# Patient Record
Sex: Male | Born: 1935 | Race: Black or African American | Hispanic: No | Marital: Single
Health system: Southern US, Community
[De-identification: ages and names within clinical notes are randomized; demographics above are authoritative.]

---

## 2003-10-18 ENCOUNTER — Ambulatory Visit: Payer: Self-pay | Admitting: Internal Medicine

## 2003-11-14 ENCOUNTER — Ambulatory Visit: Payer: Self-pay | Admitting: Internal Medicine

## 2011-07-14 ENCOUNTER — Ambulatory Visit: Payer: Self-pay | Admitting: Internal Medicine

## 2011-08-06 ENCOUNTER — Inpatient Hospital Stay: Payer: Self-pay | Admitting: Internal Medicine

## 2011-08-06 LAB — URINALYSIS, COMPLETE
Hyaline Cast: 53
Nitrite: NEGATIVE
Ph: 5 (ref 4.5–8.0)
Protein: 100
Specific Gravity: 1.023 (ref 1.003–1.030)

## 2011-08-06 LAB — LIPASE, BLOOD: Lipase: 87 U/L (ref 73–393)

## 2011-08-06 LAB — CBC WITH DIFFERENTIAL/PLATELET
Basophil #: 0 10*3/uL (ref 0.0–0.1)
Basophil %: 0.1 %
Eosinophil #: 0 10*3/uL (ref 0.0–0.7)
Eosinophil %: 0 %
HCT: 53 % — ABNORMAL HIGH (ref 40.0–52.0)
HGB: 16.8 g/dL (ref 13.0–18.0)
Lymphocyte #: 0.7 10*3/uL — ABNORMAL LOW (ref 1.0–3.6)
MCH: 28.9 pg (ref 26.0–34.0)
MCV: 91 fL (ref 80–100)
Monocyte #: 0.9 x10 3/mm (ref 0.2–1.0)
Monocyte %: 6.6 %
RBC: 5.82 10*6/uL (ref 4.40–5.90)
RDW: 13.3 % (ref 11.5–14.5)
WBC: 14.5 10*3/uL — ABNORMAL HIGH (ref 3.8–10.6)

## 2011-08-06 LAB — COMPREHENSIVE METABOLIC PANEL
Alkaline Phosphatase: 65 U/L (ref 50–136)
Anion Gap: 14 (ref 7–16)
BUN: 34 mg/dL — ABNORMAL HIGH (ref 7–18)
Bilirubin,Total: 1 mg/dL (ref 0.2–1.0)
Calcium, Total: 10.7 mg/dL — ABNORMAL HIGH (ref 8.5–10.1)
Co2: 29 mmol/L (ref 21–32)
EGFR (African American): 53 — ABNORMAL LOW
EGFR (Non-African Amer.): 46 — ABNORMAL LOW
Glucose: 129 mg/dL — ABNORMAL HIGH (ref 65–99)
Potassium: 3.5 mmol/L (ref 3.5–5.1)
SGOT(AST): 23 U/L (ref 15–37)
SGPT (ALT): 20 U/L
Sodium: 146 mmol/L — ABNORMAL HIGH (ref 136–145)

## 2011-08-07 LAB — CBC WITH DIFFERENTIAL/PLATELET
Basophil #: 0 10*3/uL (ref 0.0–0.1)
Eosinophil #: 0 10*3/uL (ref 0.0–0.7)
Lymphocyte %: 4.7 %
MCH: 29.6 pg (ref 26.0–34.0)
MCHC: 32.9 g/dL (ref 32.0–36.0)
Monocyte #: 1.6 x10 3/mm — ABNORMAL HIGH (ref 0.2–1.0)
Neutrophil #: 9.6 10*3/uL — ABNORMAL HIGH (ref 1.4–6.5)
Neutrophil %: 81.5 %
RDW: 13.6 % (ref 11.5–14.5)

## 2011-08-07 LAB — MAGNESIUM: Magnesium: 2.5 mg/dL — ABNORMAL HIGH

## 2011-08-07 LAB — BASIC METABOLIC PANEL
Anion Gap: 8 (ref 7–16)
BUN: 45 mg/dL — ABNORMAL HIGH (ref 7–18)
Calcium, Total: 9.3 mg/dL (ref 8.5–10.1)
Chloride: 107 mmol/L (ref 98–107)
Co2: 32 mmol/L (ref 21–32)
Creatinine: 1.57 mg/dL — ABNORMAL HIGH (ref 0.60–1.30)
EGFR (African American): 47 — ABNORMAL LOW
Glucose: 137 mg/dL — ABNORMAL HIGH (ref 65–99)
Osmolality: 306 (ref 275–301)
Potassium: 3.2 mmol/L — ABNORMAL LOW (ref 3.5–5.1)
Sodium: 147 mmol/L — ABNORMAL HIGH (ref 136–145)

## 2011-08-08 LAB — BASIC METABOLIC PANEL WITH GFR
Anion Gap: 9 (ref 7–16)
BUN: 40 mg/dL — ABNORMAL HIGH (ref 7–18)
Calcium, Total: 8.7 mg/dL (ref 8.5–10.1)
Chloride: 103 mmol/L (ref 98–107)
Co2: 28 mmol/L (ref 21–32)
Creatinine: 0.7 mg/dL (ref 0.60–1.30)
EGFR (African American): >60
EGFR (Non-African Amer.): >60
Glucose: 105 mg/dL — ABNORMAL HIGH (ref 65–99)
Osmolality: 290 (ref 275–301)
Potassium: 3.9 mmol/L (ref 3.5–5.1)
Sodium: 140 mmol/L (ref 136–145)

## 2011-08-09 LAB — BASIC METABOLIC PANEL
Calcium, Total: 7.9 mg/dL — ABNORMAL LOW (ref 8.5–10.1)
Chloride: 99 mmol/L (ref 98–107)
Co2: 30 mmol/L (ref 21–32)
Creatinine: 0.6 mg/dL (ref 0.60–1.30)
Osmolality: 280 (ref 275–301)
Potassium: 3.5 mmol/L (ref 3.5–5.1)

## 2011-08-10 LAB — BASIC METABOLIC PANEL
Anion Gap: 12 (ref 7–16)
Calcium, Total: 8.5 mg/dL (ref 8.5–10.1)
Chloride: 101 mmol/L (ref 98–107)
Co2: 29 mmol/L (ref 21–32)
Osmolality: 283 (ref 275–301)
Potassium: 3 mmol/L — ABNORMAL LOW (ref 3.5–5.1)

## 2011-08-10 LAB — URINE CULTURE

## 2011-08-11 LAB — BASIC METABOLIC PANEL
BUN: 16 mg/dL (ref 7–18)
Chloride: 100 mmol/L (ref 98–107)
EGFR (African American): >60
EGFR (Non-African Amer.): >60
Glucose: 75 mg/dL (ref 65–99)
Osmolality: 281 (ref 275–301)

## 2011-08-11 LAB — MAGNESIUM: Magnesium: 2.1 mg/dL

## 2011-08-11 LAB — CULTURE, BLOOD (SINGLE)

## 2011-08-14 ENCOUNTER — Ambulatory Visit: Payer: Self-pay | Admitting: Internal Medicine

## 2012-03-13 DEATH — deceased

## 2013-08-26 IMAGING — CR DG CHEST 1V
1 series · 3 of 3 positions shown · non-contrast
Comparison: none

REASON FOR EXAM: ?infiltrate
COMMENTS:

PROCEDURE:     DXR - DXR CHEST 1 VIEWAP OR PA  - August 06, 2011  [DATE]
RESULT:     Comparison: None

[Series 1: x chest ap · 0.14mm/px · 3 of 3 slices shown]
[im 1/3]
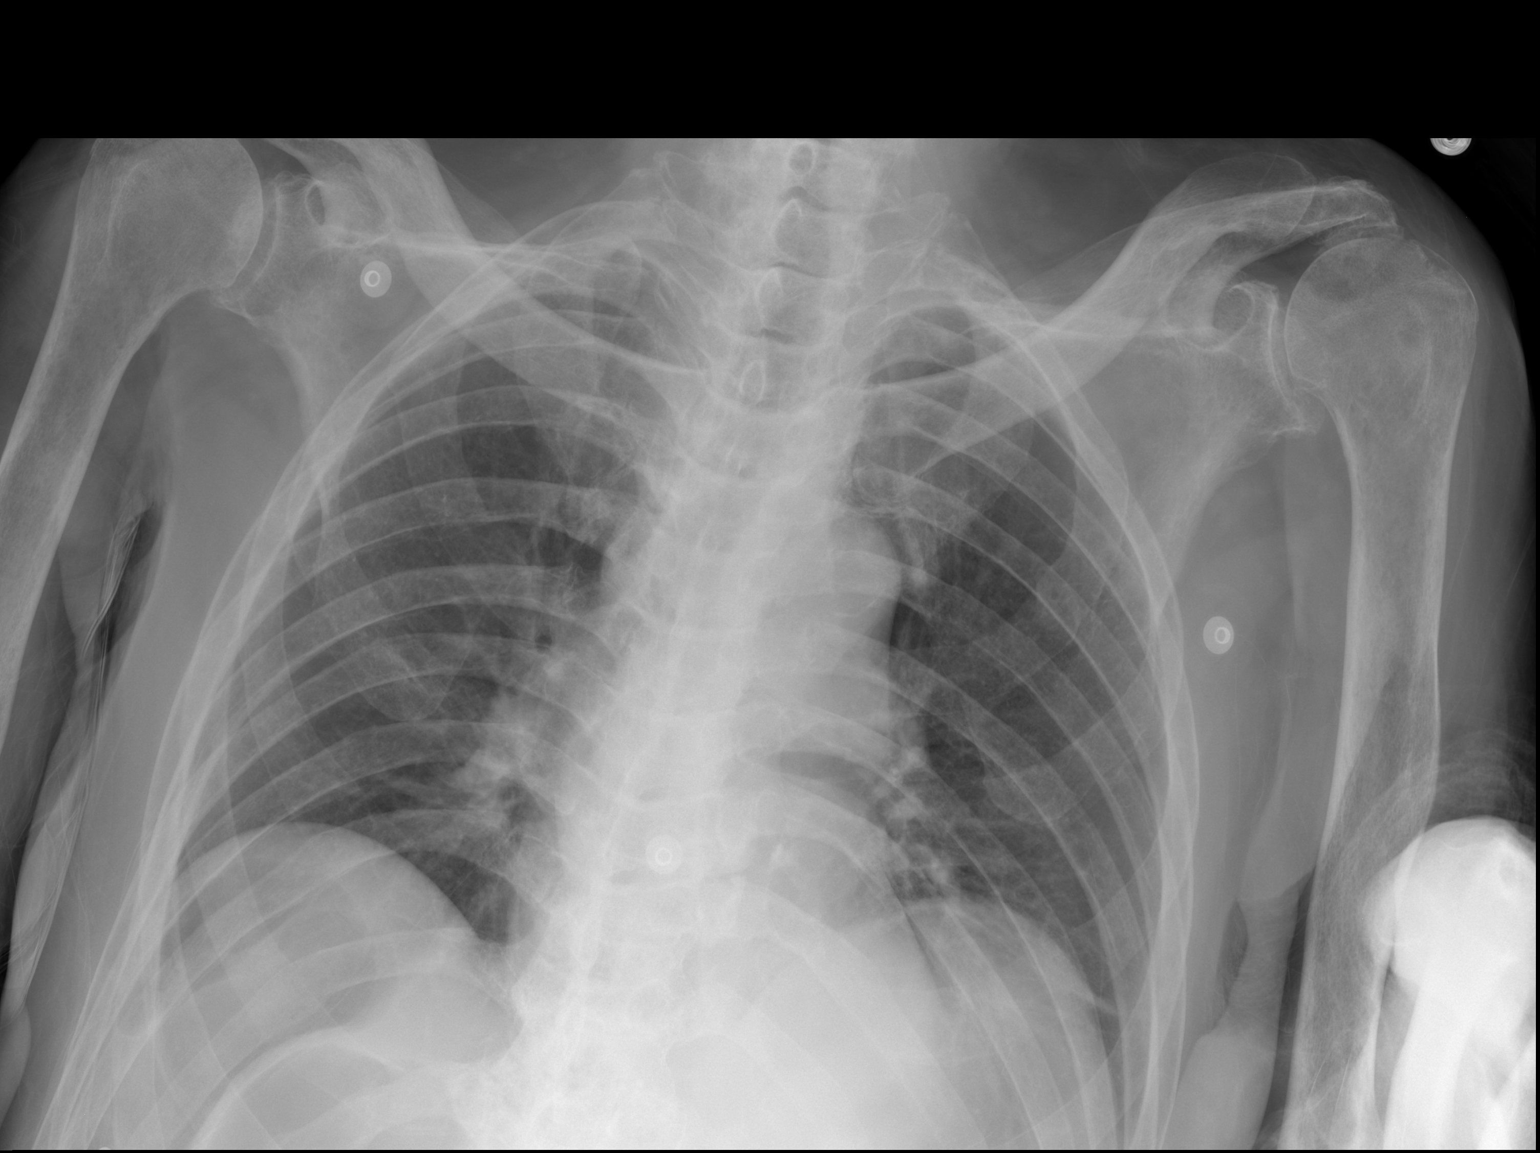
[im 2/3]
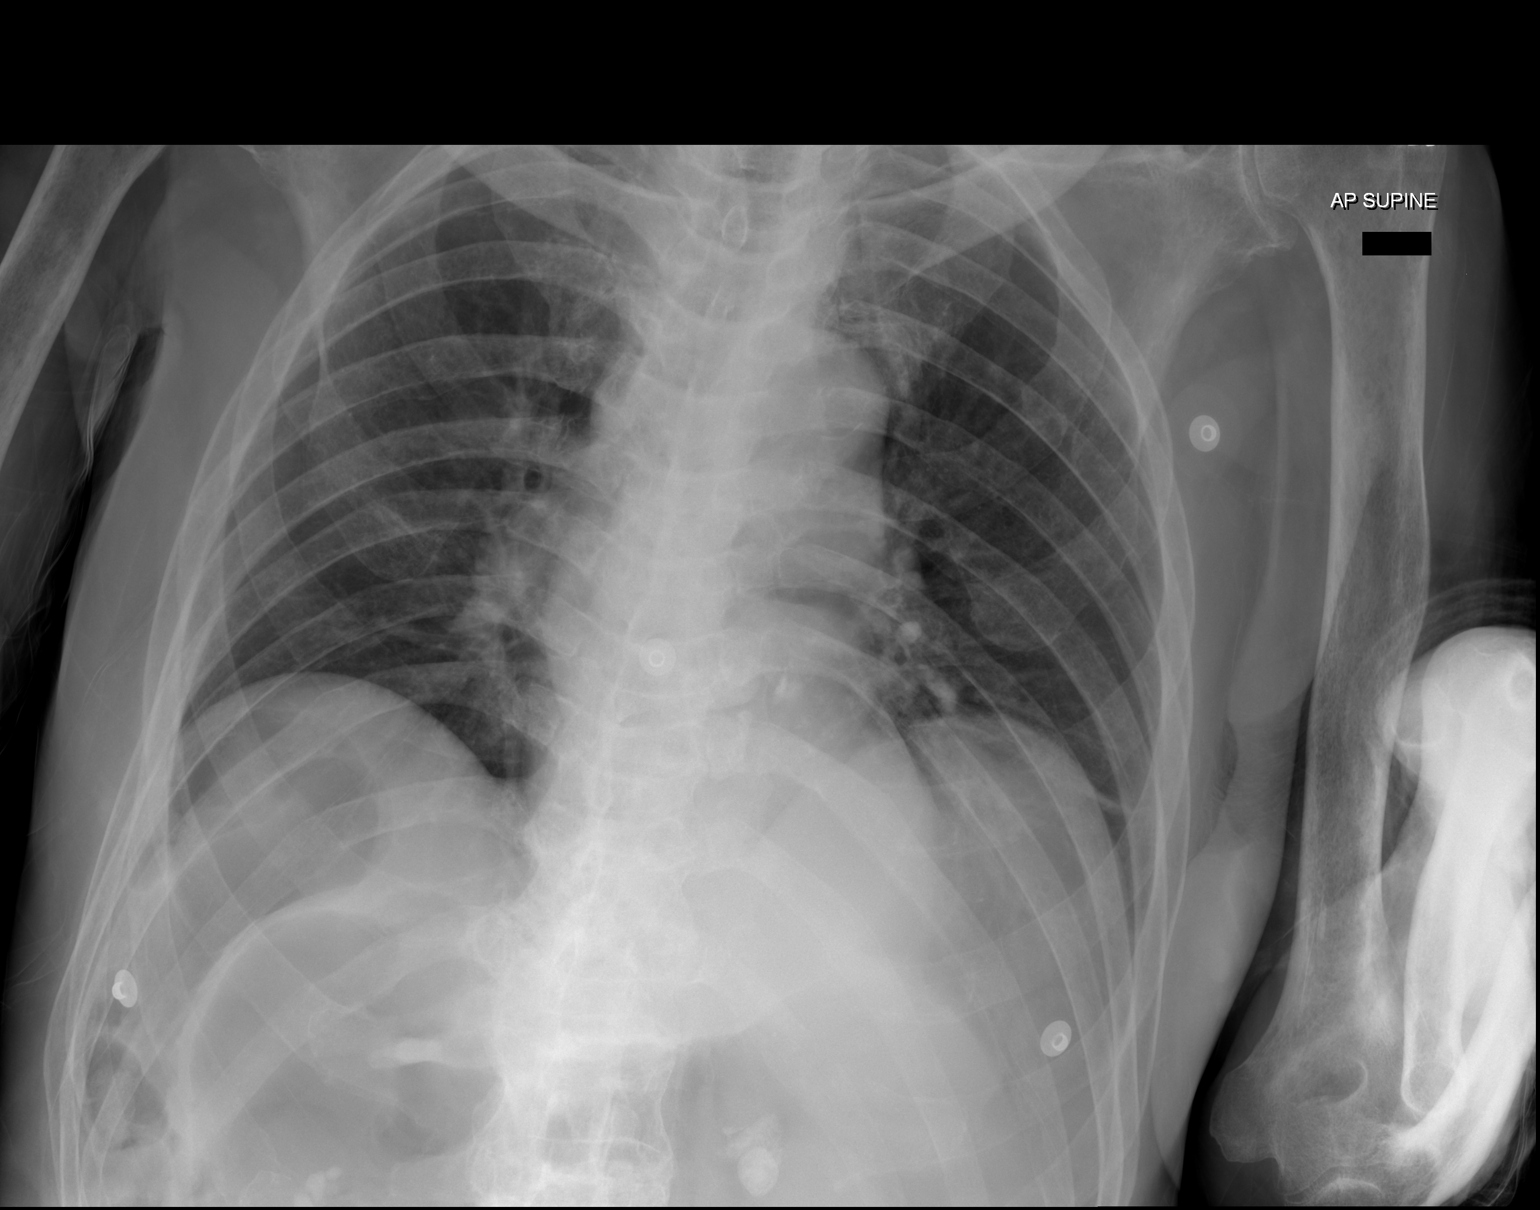
[im 3/3]
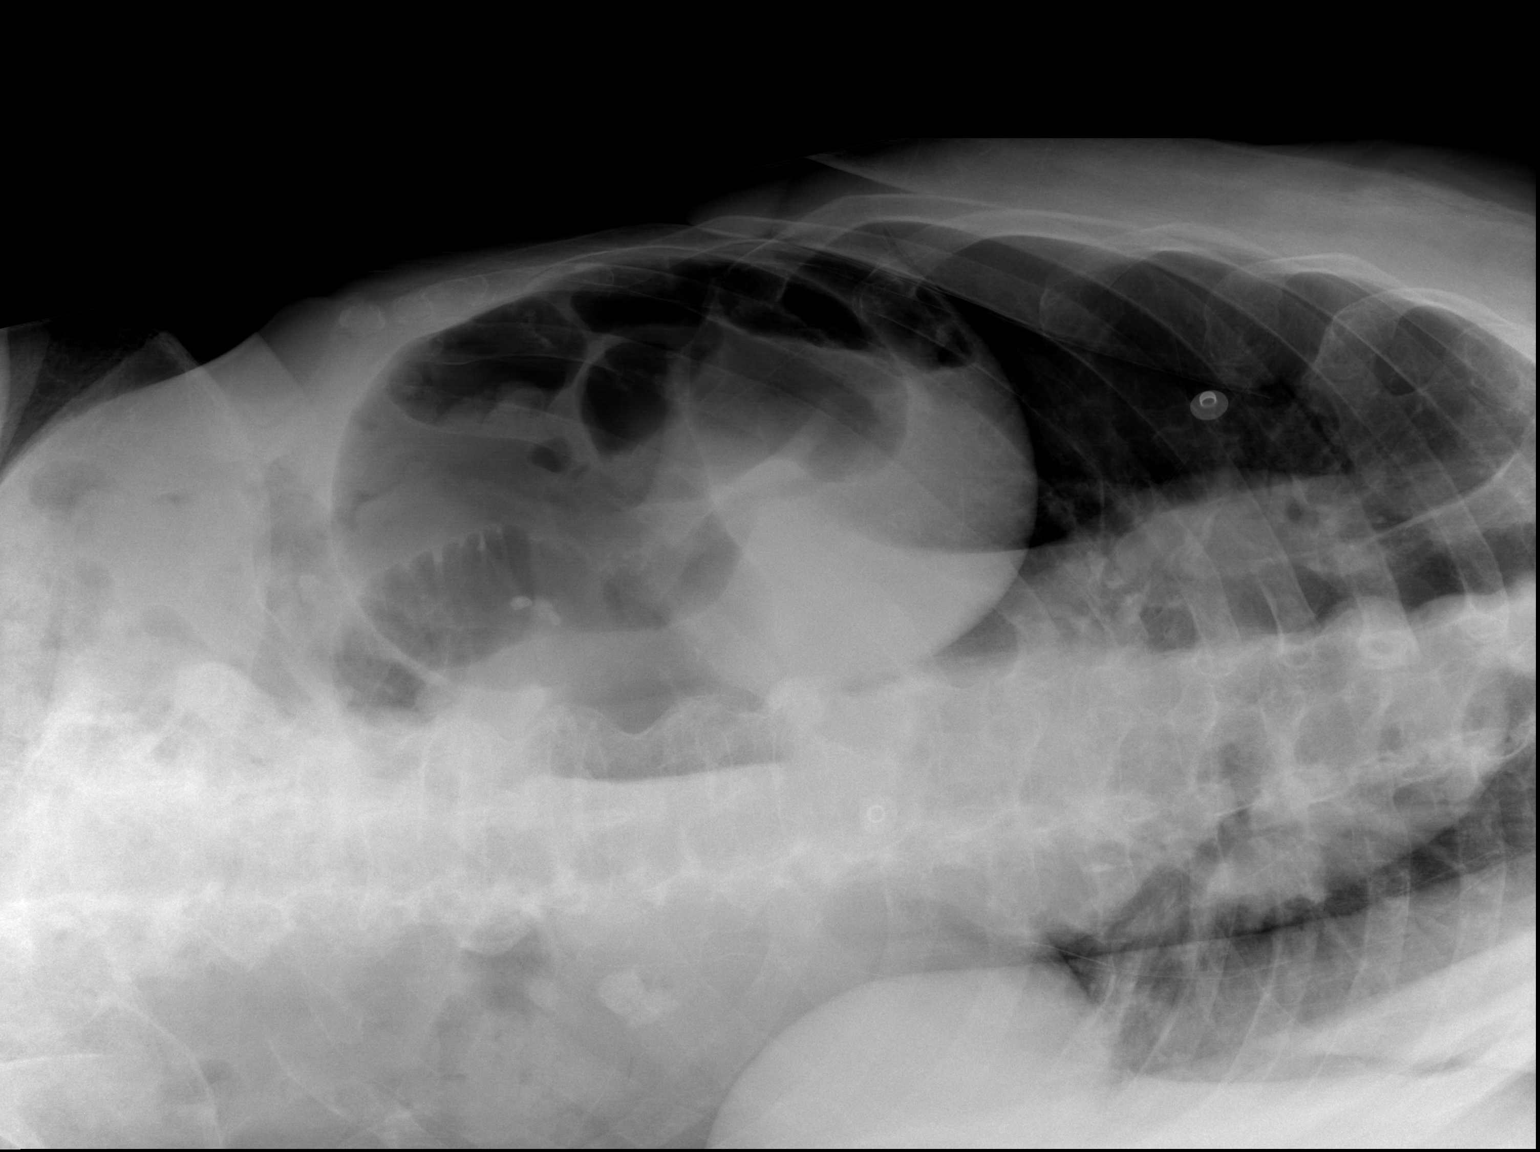

[3 of 3 positions shown; findings below may reference images not displayed]

FINDINGS: Single portable AP chest radiograph is provided.  There is no focal
parenchymal opacity, pleural effusion, or pneumothorax. Normal
cardiomediastinal silhouette.

There is gaseous distention of numerous bowel loops which is incompletely
evaluated.
IMPRESSION: No acute disease of the che[REDACTED]

## 2013-08-26 IMAGING — CR DG ABDOMEN 2V
1 series · 3 of 3 positions shown · non-contrast
Comparison: none

REASON FOR EXAM: vomiting
COMMENTS:

PROCEDURE:     DXR - DXR ABDOMEN 2 V FLAT AND ERECT  - August 06, 2011  [DATE]
RESULT:     Comparisons:  None

[Series 1: x abdomen supine · 0.14mm/px · 3 of 3 slices shown]
[im 1/3]
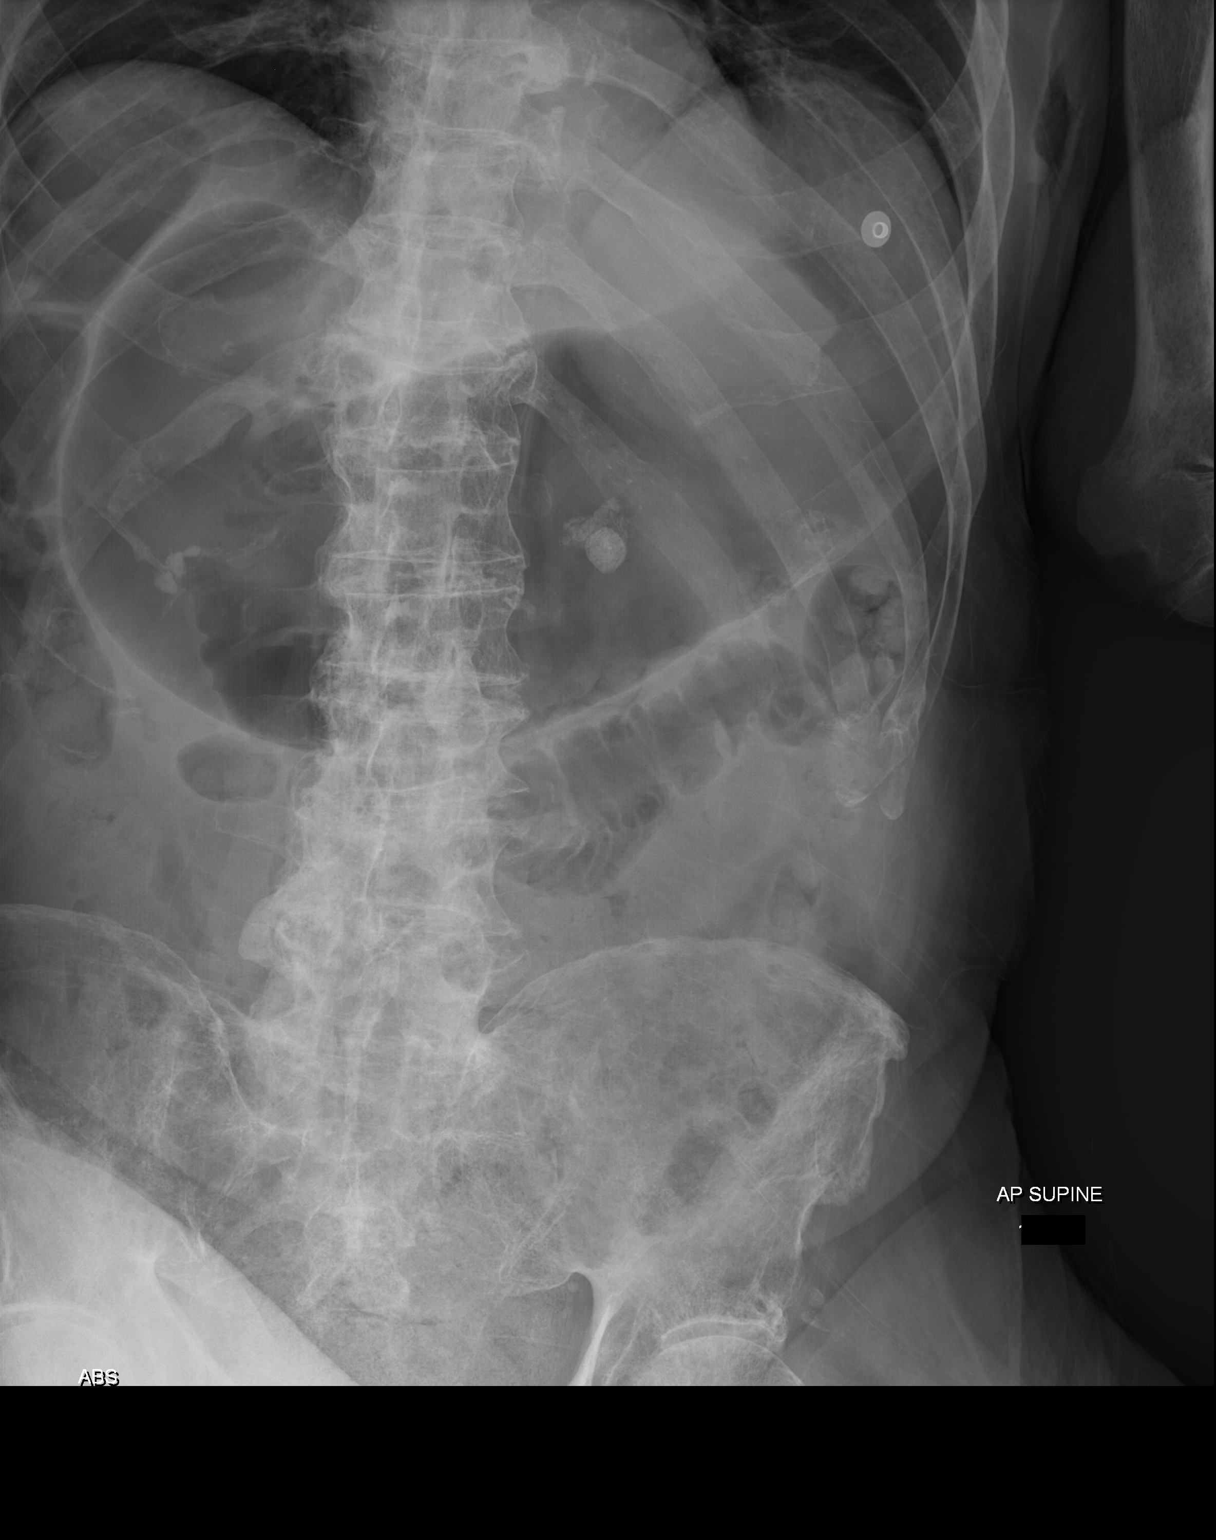
[im 2/3]
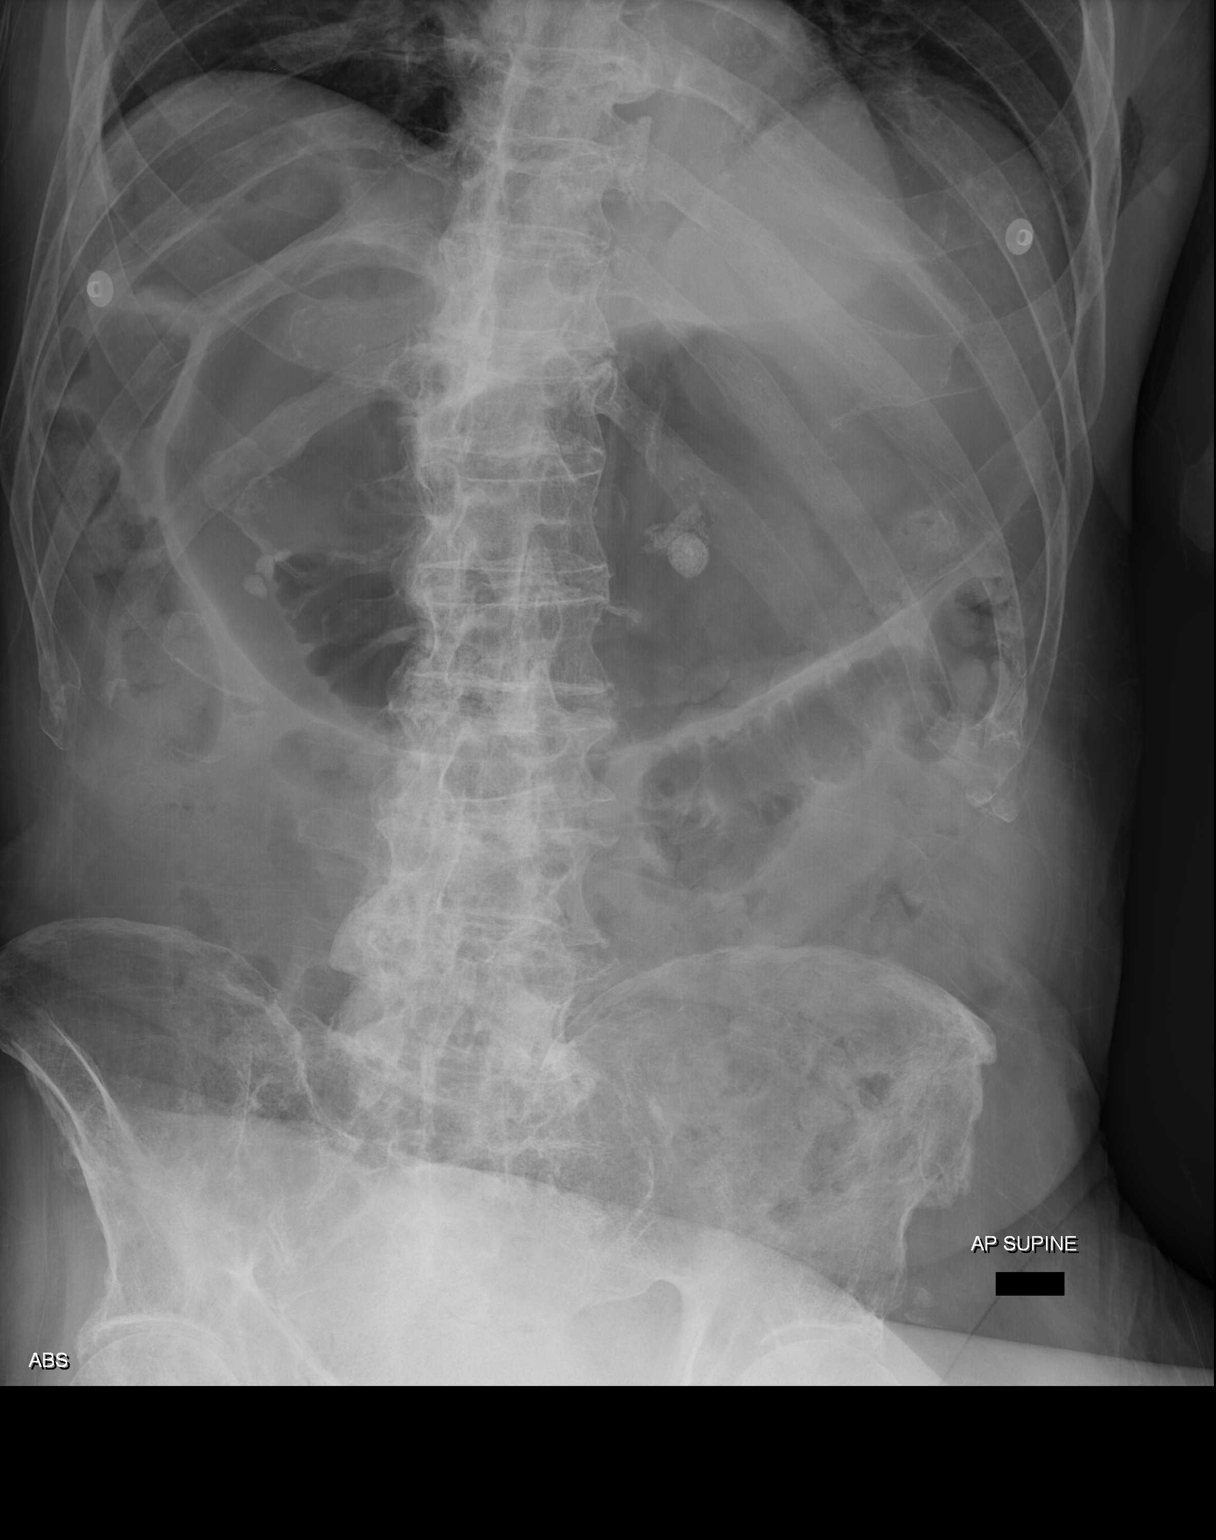
[im 3/3]
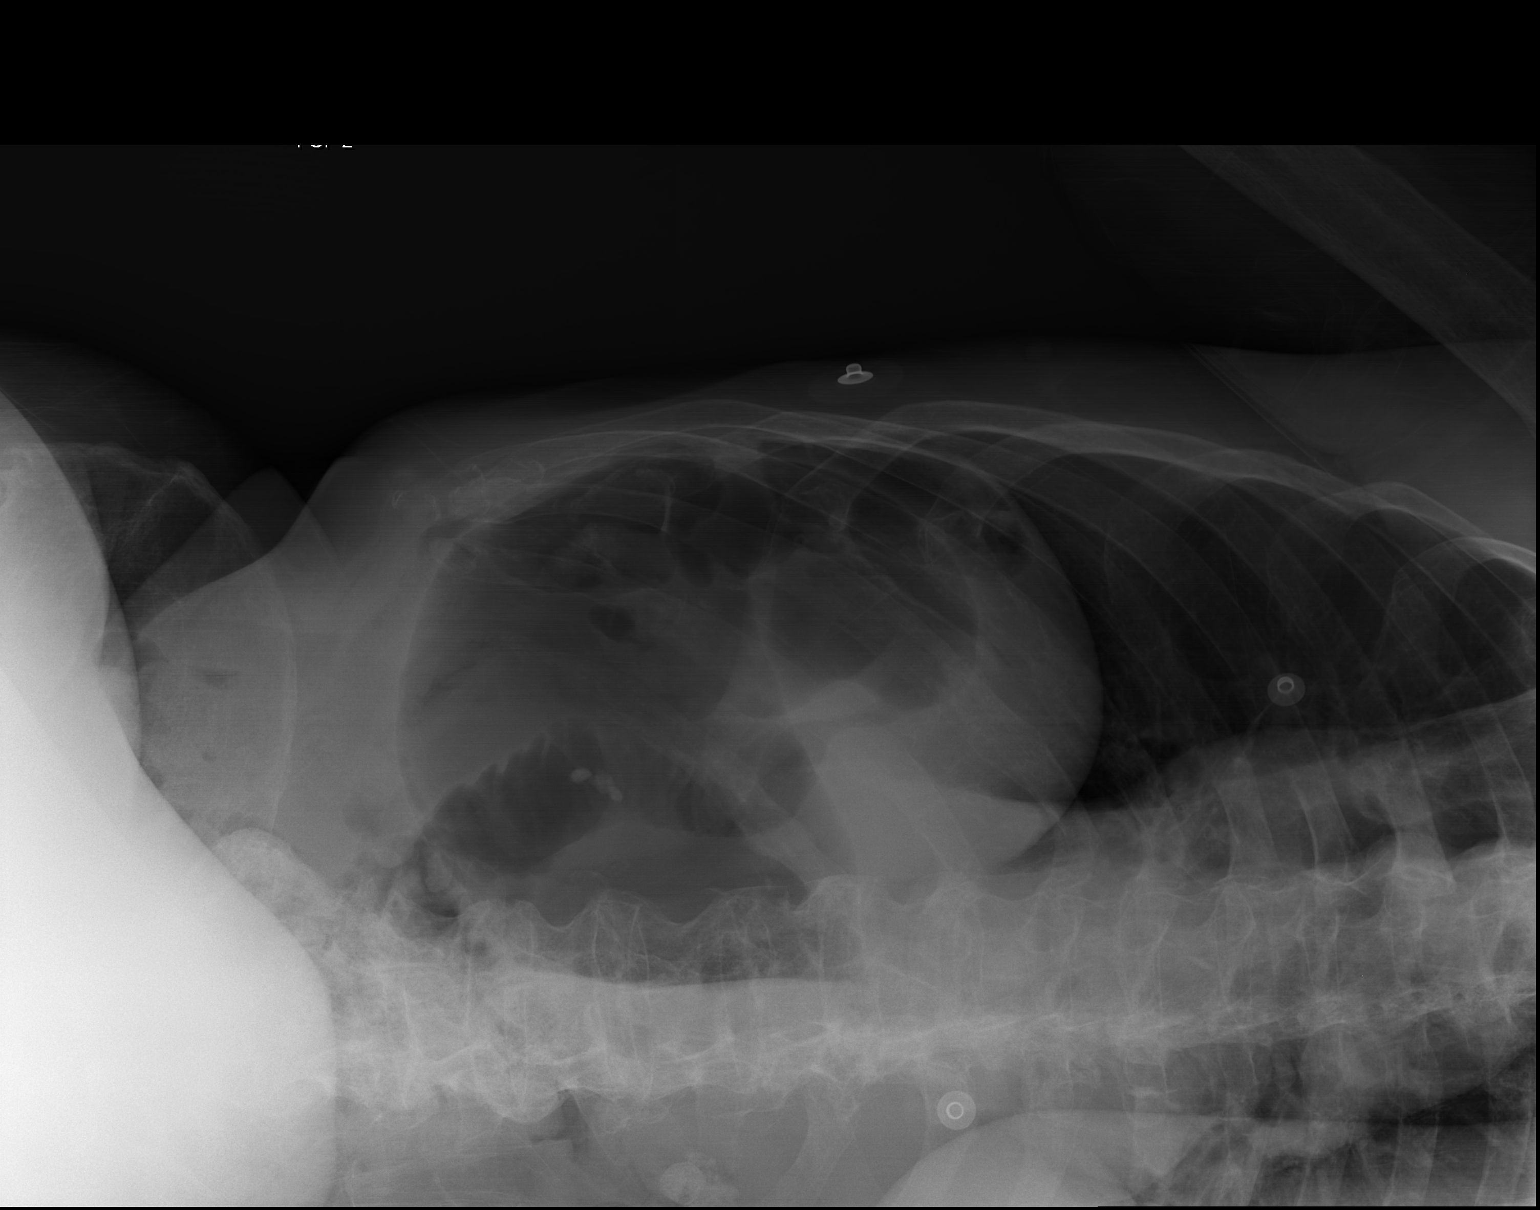

[3 of 3 positions shown; findings below may reference images not displayed]

FINDINGS: Supine and upright views of the abdomen are provided.

There is a dilated air filled structure in the upper abdomen likely
representing a dilated stomach. There no definite air-fluid levels. There is
no pathologic calcification along the expected course of the ureters. There
is no evidence of pneumoperitoneum, portal venous gas, or pneumatosis.

The osseous structures are unremarkable.
IMPRESSION: Dilated air-filled stomach. The appearance is concerning for gastric outlet
obstruction. Further evaluation with a CT of the abdomen is recommended.

[REDACTED]

## 2013-08-26 IMAGING — CT CT ABD-PELV W/O CM
1 of 3 series · 10 of 32 positions shown, 13 images · non-contrast
Comparison: none

REASON FOR EXAM: (1) vomiting; (2) vomiting
COMMENTS:

[Series 6: soft tissue 2 · axial · 0.66mm/px · z∈[-1044,-585]mm · 10 of 187 slices shown, 13 images]
[im 14/187  soft-tissue]
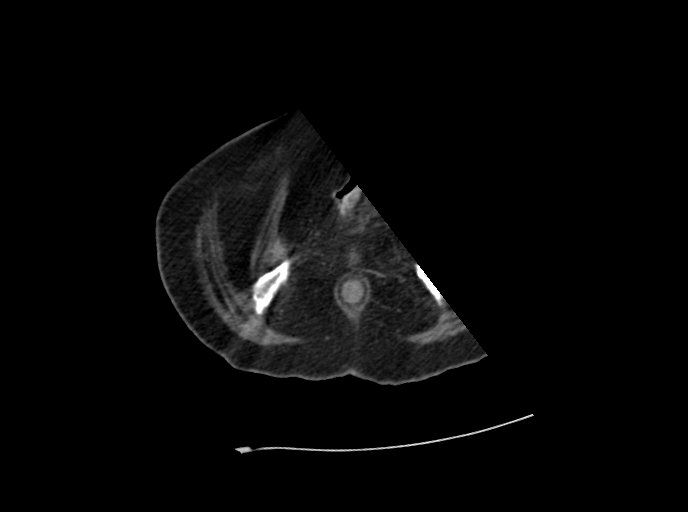
[im 14/187  bone]
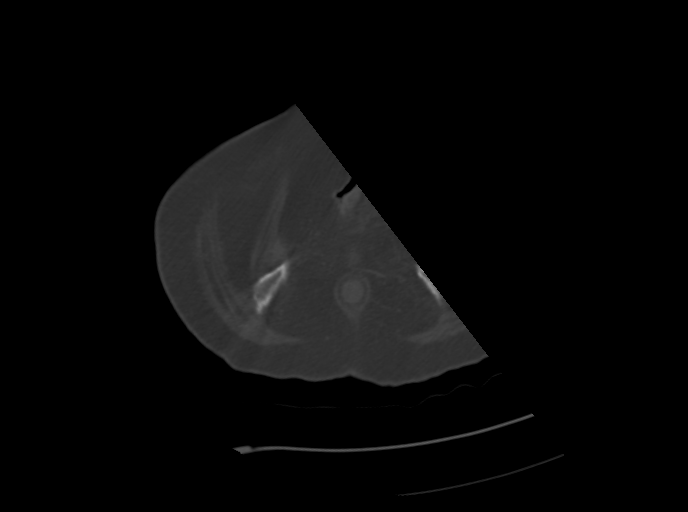
[im 40/187  soft-tissue]
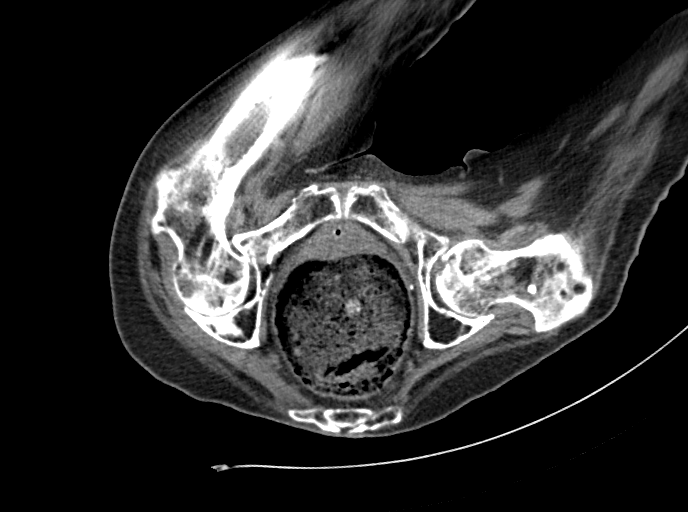
[im 67/187  soft-tissue]
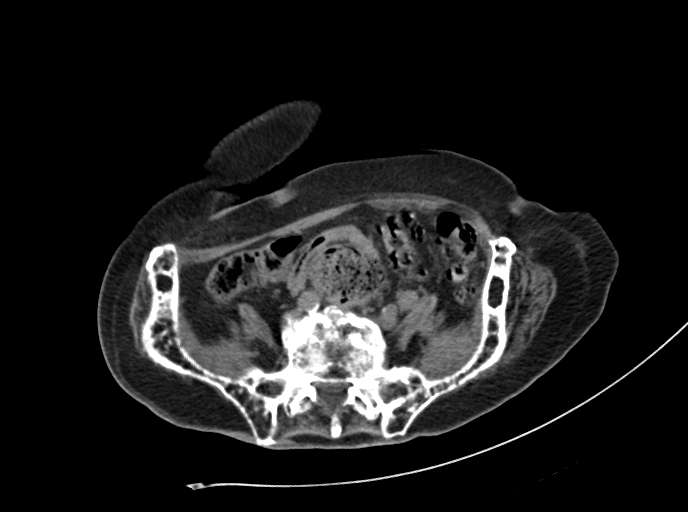
[im 80/187  soft-tissue]
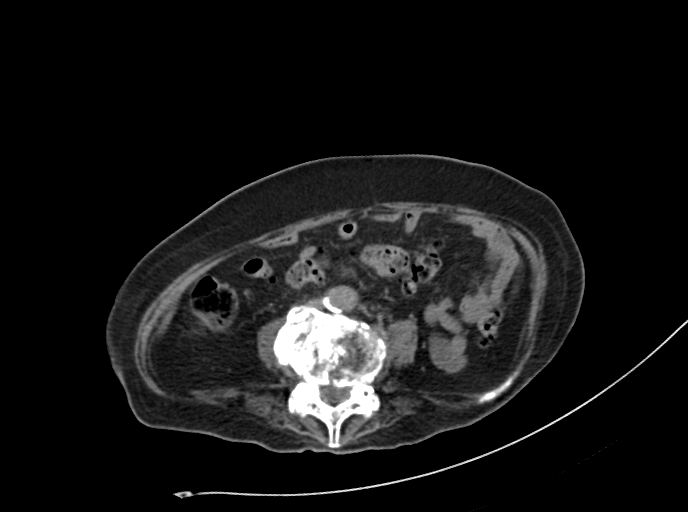
[im 107/187  soft-tissue]
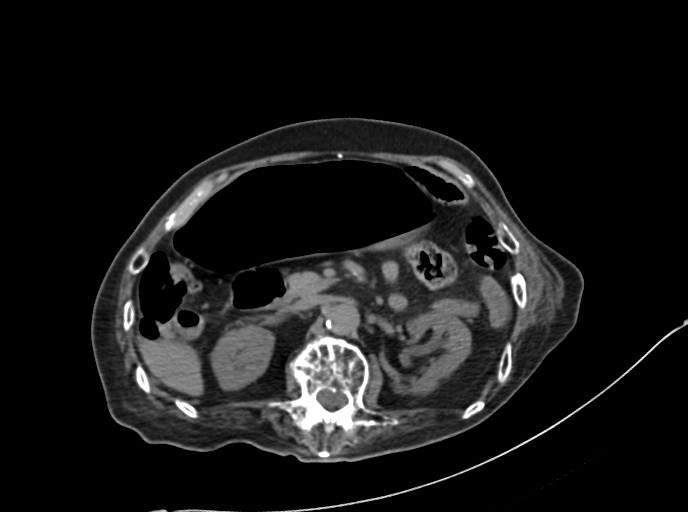
[im 120/187  soft-tissue]
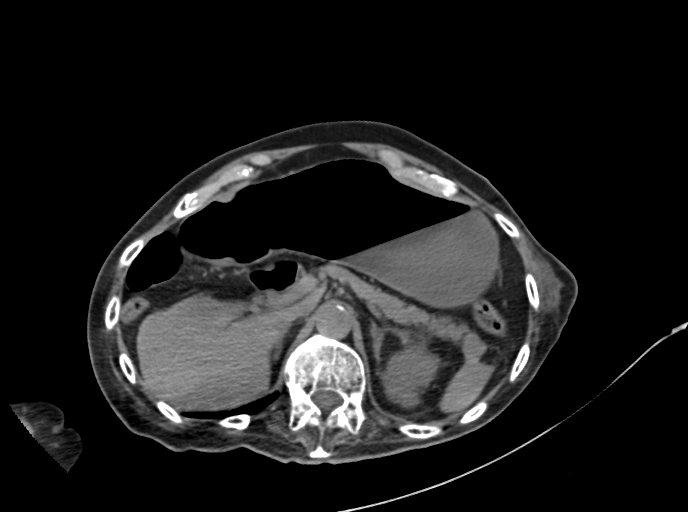
[im 133/187  lung]
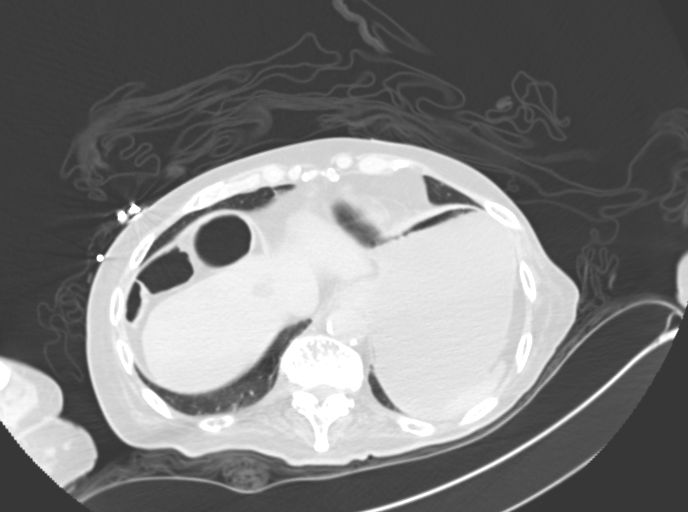
[im 147/187  soft-tissue]
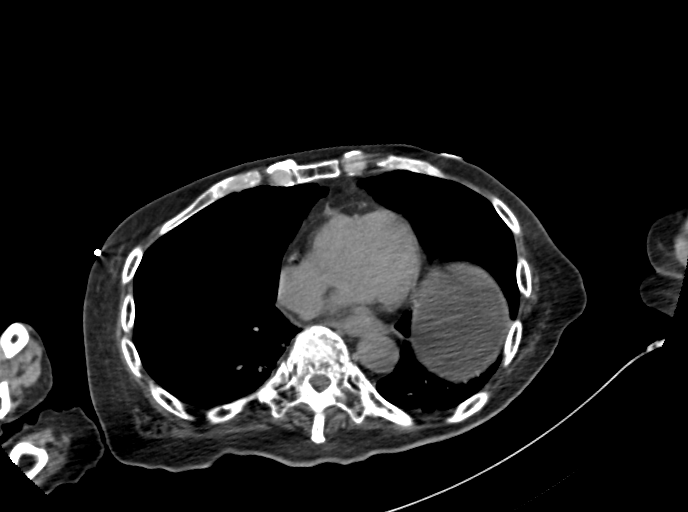
[im 147/187  lung]
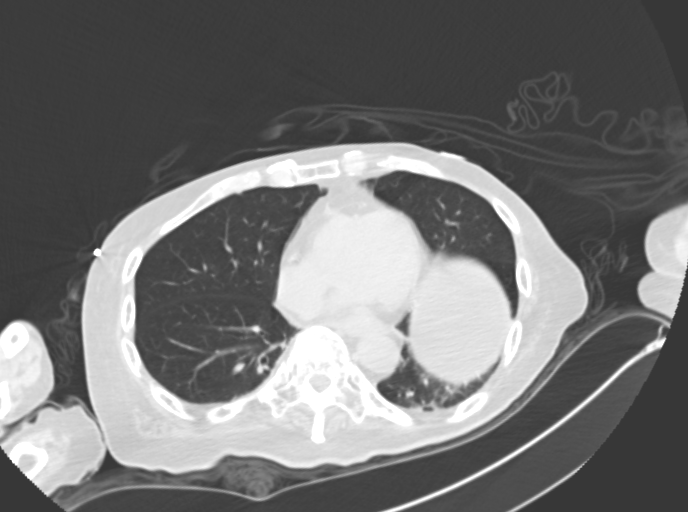
[im 160/187  lung]
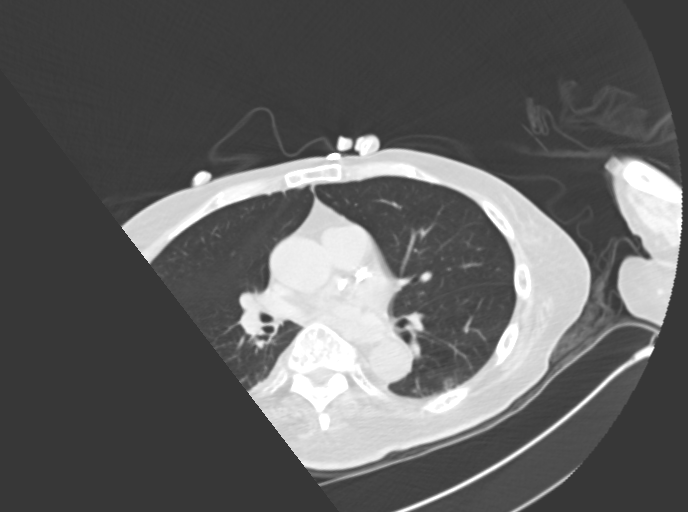
[im 173/187  soft-tissue]
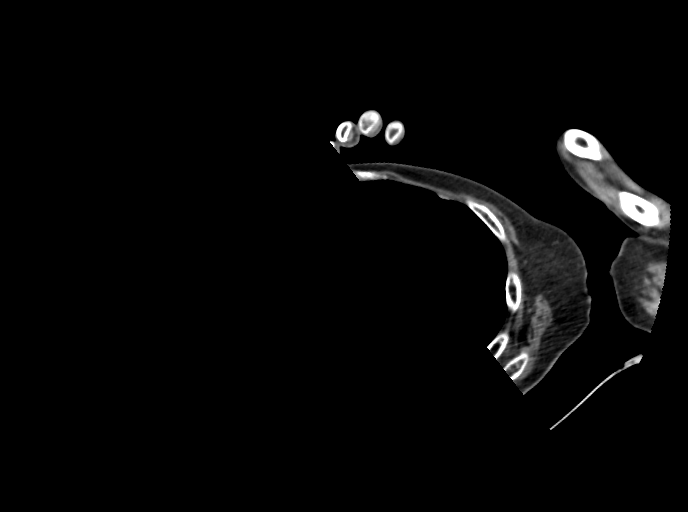
[im 173/187  lung]
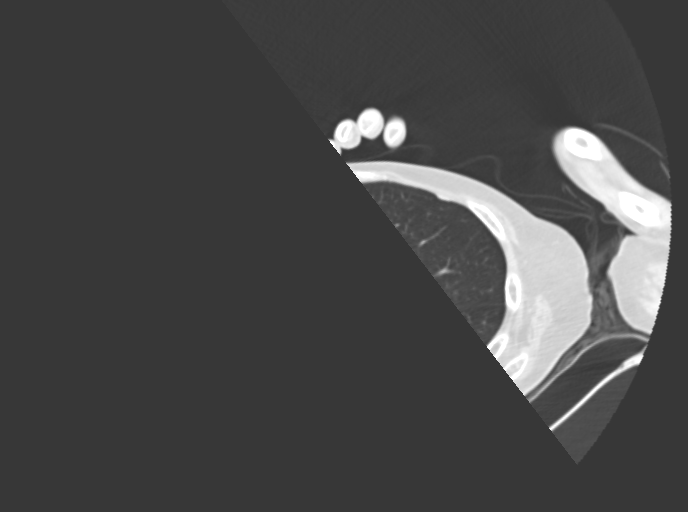

[10 of 32 positions shown; findings below may reference images not displayed]

PROCEDURE:     CT  - CT ABDOMEN AND PELVIS W[DATE] [DATE]

RESULT:     Axial noncontrast CT scanning was performed through the abdomen
pelvis. The patient's positioning is limited due to contractures. The
patient could not receive IV contrast due to borderline renal function and
history of multiple myeloma. The patient also was unable tolerate oral
contrast.

The stomach is distended with fluid and gas. The small bowel exhibits no
more than mild gaseous distention in its proximal portion. There is no
evidence of a small or large bowel obstruction. There are loops of bowel
interposed between the anterior abdominal wall and the anterior border of
the liver which can be symptomatic in the appropriate clinical setting. A
normal appendix is demonstrated. There is a a large amount of stool in the
rectum which may reflect a fecal impaction. The urinary bladder is
decompressed with a Foley catheter. There is calcification in the prostate
gland.

Near the dome of the liver there are hypodensities which likely reflect
cysts. There is no intrahepatic ductal dilation. The gallbladder exhibits no
evidence of stones. The pancreas, spleen, end adrenal glands are normal in
appearance. The right kidney exhibits nonobstructing mid pole stones. An
exophytic midpole hypodensity measuring 1.8 cm in diameter has Hounsfield
measurement of +1 and is compatible with a cyst. The left kidney exhibits
nonobstructing upper pole calyceal stones. There is a mid to upper pole
cortically-based hypodensity measuring 3.3 x 2.5 cm. It exhibits Hounsfield
measurement of -13. The caliber of the abdominal aorta is normal.

The bones exhibit a diffuse permeative type pattern likely reflective of the
known multiple myeloma. The lung bases exhibit mild emphysematous changes.
There is no evidence of pneumonia.
IMPRESSION: 1. There is distention of the stomach with fluid and gas. Nasogastric
suction would be useful.
2. There is a large amount of stool in the rectum that suggests a fecal
impaction. There is no evidence of a small or large bowel obstruction nor
objective evidence of an ileus type pattern.
3. There are findings in both kidneys as described which likely reflect
cysts and nonobstructing stones. There is no evidence of acute urinary tract
abnormality.
4. There is diffuse bony abnormality consistent with known multiple myeloma.

## 2013-08-30 IMAGING — CR DG CHEST 1V PORT
1 series · 1 of 1 positions shown · non-contrast
Comparison: none

REASON FOR EXAM: verify PICC line placement
COMMENTS:

[portable]
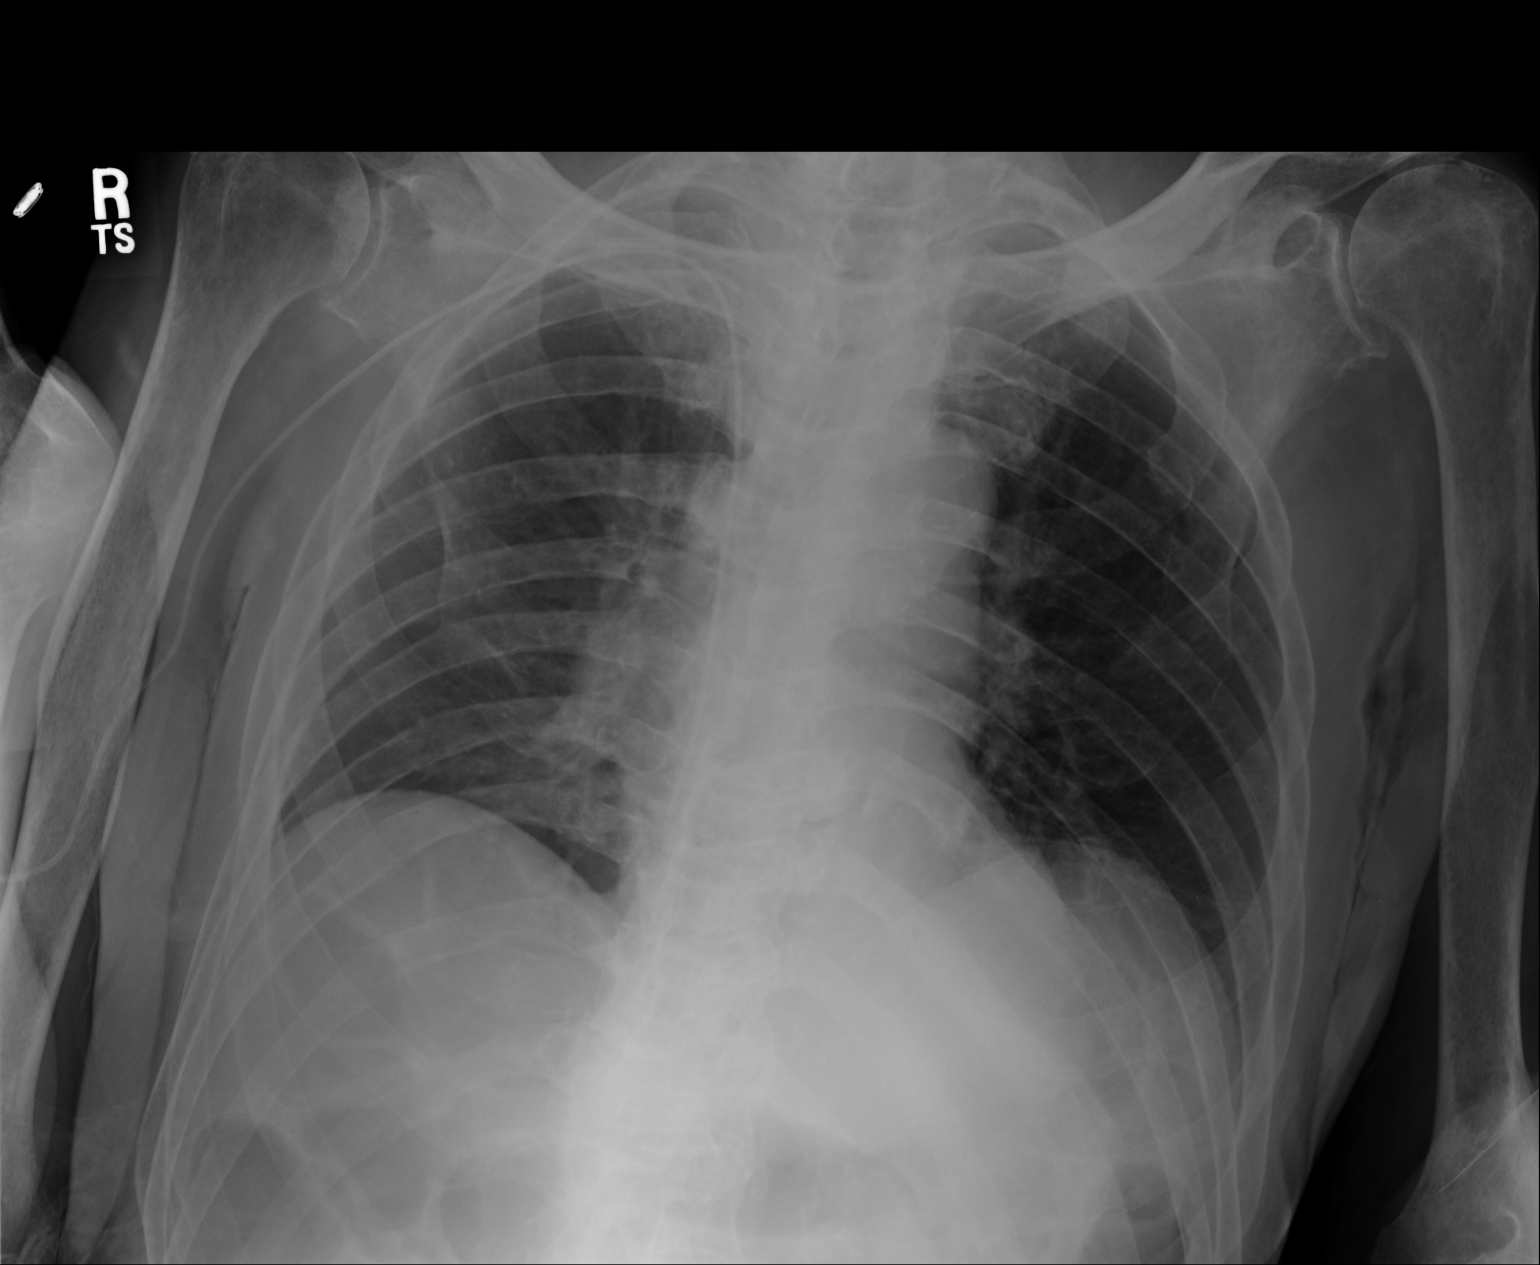

[1 of 1 positions shown; findings below may reference images not displayed]

PROCEDURE:     DXR - DXR PORTABLE CHEST SINGLE VIEW  - August 10, 2011 [DATE]

RESULT:     Comparison is made to a study 10 August, 2011.

The lungs are hypoinflated. There is no focal infiltrate. The cardiac
silhouette is not enlarged. The pulmonary vascularity is not engorged. A
PICC line has been placed with the tip of the catheter lying in the region
of the distal SVC at its junction with the right atrium.
IMPRESSION: The patient undergone interval placement of a PICC line
with good position radiographically.

[REDACTED]

## 2014-05-02 NOTE — Discharge Summary (Signed)
PATIENT NAME:  Anthony Cummings, Anthony Cummings MR#:  [id number] DATE OF BIRTH:  1927/03/30  DATE OF ADMISSION:  08/06/2011 DATE OF DISCHARGE:  08/11/2011  PRIMARY CARE PHYSICIAN: Dan Humphreys, MD  FINAL DIAGNOSES:  1. Abdominal distention with nausea, vomiting, obstipation, fecal impaction, and constipation.  2. Acute renal failure and hypernatremia.  3. Urinary tract infection.  4. Hypokalemia, hypomagnesemia.  5. Failure to thrive.  6. Multiple myeloma.   DISCHARGE MEDICATIONS: 1. Artificial Tears ophthalmic solution one drop to each eye once a day as needed for dry eyes.  2. Cephalexin 500 mg every eight hours for five more days then stop.  3. Colace 100 mg twice a day. 4. Lactulose 30 mL once a day as needed for constipation. 5. Potassium chloride 20 mEq daily.  6. Magnesium oxide 400 mg daily.   DIET: Puree diet.   ACTIVITY: Activity as tolerated.   REFERRALS: To hospice.   DISCHARGE FOLLOWUP AND INSTRUCTIONS: Followup in 1 to 2 days with Dr. Ivin Booty. Check BMP and magnesium on Friday.   REASON FOR ADMISSION: The patient was admitted 08/06/2011. He had bilious vomiting, elevated white count, and a urinalysis suggestive of urinary tract infection. Palliative Care consultation was obtained. The patient initially was started on IV Levaquin.   LABORATORY, DIAGNOSTIC AND RADIOLOGIC DATA: Lipase 87. Glucose 129, BUN 34, creatinine 1.4, sodium 146, potassium 3.5, chloride 103, CO2 29, and calcium 10.7. Liver function tests: Total protein elevated at 8.5.   Blood cultures actually grew out contaminant.   White blood cell count 14.5, hemoglobin and hematocrit 16.8 and 53.0, and platelet count 242.   EKG showed sinus tachycardia, premature ventricular complexes, left axis deviation.   Urine culture grew out pansensitive Escherichia coli.   Urinalysis was positive for leukocyte esterase and bacteria.   Chest x-ray: No acute disease in the chest.  Abdomen, flat and upright, showed  dilated air-fluid stomach.   CT scan of the abdomen and pelvis without contrast showed distention of the stomach with fluid and gas. Large amount of stool in the rectum.  On 08/07/2011 creatinine was up at 1.57 and sodium 148.  On 08/07/2011 KUB showed large amount of gas present in the stomach and small bowel.  Repeat chest x-ray on 08/10/2011 showed PICC in place.   Laboratory data on 08/11/2011 showed a platelet count 165, glucose 75, BUN 16, creatinine 0.56, sodium 141, potassium 3.4, chloride 100, CO2 33, and magnesium 2.1.   HOSPITAL COURSE PER PROBLEM LIST:  1. For the patient's abdominal distention, obstipation, fecal impaction, and constipation, the patient was initially given IV fluids, stopped on the pain medications, had a NG tube, and then started slowly on diet, lactulose needed for bowel movement. The patient did move his bowels. He has very poor appetite and has per the daughter this has been going on for awhile. He will follow up with hospice as outpatient. Hopefully they will continue to try to keep him out of the hospital. The patient does have a few bites with each meal, needs to be fed with assistance. He is a DO NOT RESUSCITATE.  2. For the patient's acute renal failure and hypernatremia, he was given IV fluid hydration. This was switched over to D5W. My major concern with him is that he is not going to keep up with his nutritional needs and his kidney function will get impaired and he will become dehydrated again. Can check a BMP and magnesium on Friday.  3. Urinary tract infection with Escherichia coli. The patient  was on Levaquin initially, switched to Zosyn, then switched to Rocephin, and now on Keflex upon discharge, pansensitive Escherichia coli.  4. Hypokalemia and hypomagnesemia. This was replaced via the IV and now orally. Again with his nutritional status in question, this can continue to be an issue for him in the future.  5. Failure to thrive. Overall prognosis is  poor. Case was discussed with his daughter. They are comfortable going back to [facility] with hospice care. They will try to keep him out of the hospital.  6. Multiple myeloma, not on any treatment for this. I did stop medications aspirin, Norco, Paroxetine, and Phenergan.   TIME SPENT ON DISCHARGE: 40 minutes.  ____________________________ Tana Conch. Leslye Peer, MD rjw:slb D: 08/11/2011 13:15:33 ET T: 08/11/2011 13:32:56 ET JOB#: [id number]  cc: Tana Conch. Leslye Peer, MD, <Dictator> Dan Humphreys, MD Marisue Brooklyn MD ELECTRONICALLY SIGNED 08/11/2011 17:21

## 2014-05-02 NOTE — H&P (Signed)
PATIENT NAME:  Anthony Cummings, FIERO MR#:  [id number] DATE OF BIRTH:  [birth year]  DATE OF ADMISSION:  08/06/2011  REFERRING PHYSICIAN: Dr. Ulice Brilliant  PRIMARY CARE PHYSICIAN: Dr. Alexandria Lodge   HISTORY OF PRESENT ILLNESS: Patient is an 81 year old African American male bedbound with functional quadriplegia it appears at baseline with history of multiple myeloma who used to go to [facility] for treatment but refused several years ago with history of hypertension who was transferred from [facility] after patient experienced multiple episodes of emesis, nausea. The above history was obtained from calling the center as well as some from the patient and the ER. Patient stated that his symptoms started on Friday but per records started yesterday as well as the staff at the facility. He has had more than five episode of bilious vomitus. He does not complaining of anything besides the nausea and vomiting. Here he is found to have creatinine of 1.41 with some dehydration with WBC of 14.5. He also has a urinalysis suggestive of a urinary tract infection. The CT scan of abdomen and pelvis without contrast was performed which showed distention of the stomach with fluid and gas and probable fecal impaction. Hospitalist service was contacted for further evaluation and management. Patient denies having any chest pain, shortness of breath, cough, abdominal pain. He does not know when his last bowel movement was. He is also legally blind.   PAST MEDICAL HISTORY:  1. History of multiple myeloma not on any treatment currently. 2. History of constipation. 3. History of failure to thrive. 4. Generalized pain.  5. History of anemia of chronic disease.  6. History of osteoarthritis. 7. History of chronic kidney disease. 8. History of hypertension. 9. History of vitamin B6 deficiency. 10. Blindness.   ALLERGIES: Beta blockers causing bradycardia.    HOME MEDICATIONS:  1. Artificial tears ophthalmic solution one drop each eye  once a day as needed for dry eyes.  2. Aspirin 81 mg daily. 3. Norco 5/325, 1 tab daily.  4. Phenergan 25 mg/mL injectable 1 mL q.6 hours as needed for nausea and vomiting.  5. Pyridoxine 100 mg two times a day. 6. Senna-S 50/8.6 mg 2 tabs once a day.   SOCIAL HISTORY: He lives at [facility] facility. He has been there since 2004. Currently does not smoke or drink alcohol. No other drug use.   FAMILY HISTORY: Per daughter no significant history of myocardial infarction or coronary artery disease. Otherwise she does not know much about patient's side of history.  REVIEW OF SYSTEMS: This is limited as patient denies almost everything except for nausea and vomiting. CONSTITUTIONAL: Denies fever. EYES: Has blindness. ENT: No hearing loss or snoring. RESPIRATORY: Denies cough or shortness of breath or wheezing. CARDIOVASCULAR: Denies chest pains. Has history of high blood pressure. GASTROINTESTINAL: Has nausea, vomiting. Also has a history of constipation. GENITOURINARY: Denies dysuria or frequency. ENDOCRINE: Denies polyuria, nocturia. HEME/LYMPH: Denies anemia or easy bruising. SKIN: Denies any rashes. MUSCULOSKELETAL: Denies any joint or muscle pain. NEUROLOGICAL: Denies numbness, weakness, CVA. PSYCHIATRIC: Denies anxiety or insomnia.   PHYSICAL EXAMINATION:  VITAL SIGNS: Temperature on arrival 98.7, pulse rate was 121 on arrival, currently 115, respiratory rate 18, blood pressure 114/63 on arrival, oxygen saturation 96% on room air, currently he is on 100% on room air.   GENERAL: Patient is an elderly chronically ill-appearing African American male lying in bed, no obvious distress with some dry heaving. He is in the fetal position with bilateral upper extremity contractures and flexion  of wrists bilaterally with contractures of lower extremities.   HEENT: Has got temporal wasting. Dry mucous membranes. Poor dentition. Eyes remain closed.   NECK: Supple. No JVD.   CARDIOVASCULAR: S1, S2,  tachycardic. No murmurs, rubs, or gallops appreciated.   LUNGS: Anterior fields are clear to auscultation without wheezing or crackles.   ABDOMEN: Soft, slight distention, hyperactive bowel sounds all quadrants. No tenderness on palpation. No organomegaly noted.   EXTREMITIES: No significant lower extremity edema.   NEUROLOGICAL: Patient is able to remove limbs very little. Strength appears to be 3+/5 all extremities.   NEUROLOGIC: Patient is a pleasant. Awake, alert, oriented x3. Cooperative mostly.   LABORATORY, DIAGNOSTIC, AND RADIOLOGICAL DATA: BUN 34, creatinine 1.41, sodium 146, calcium 10.7, total protein 8.5. Otherwise LFTs within normal limits. WBC 14.5, hematocrit 53, hemoglobin 16.8. Urinalysis 2+ blood, 2+ leukocyte esterase, negative nitrites, 244 RBCs, 1331 WBCs, 3+ bacteria, WBC clumps as well as budding yeast and some hyaline casts. CT of abdomen and pelvis as above including cysts and nonobstructing stones in both kidneys and diffuse bony abnormalities consistent with known multiple myeloma. Abdomen flat and erect showing dilated air filled stomach. Appearance is concerning for gastric outlet obstruction. EKG sinus tachycardia with frequent and consecutive PVCs, left axis deviation, no acute ST elevations or depressions.   ASSESSMENT AND PLAN: We have an 81 year old African American male with history of multiple myeloma, currently not on any treatment, nursing home resident, bedbound which appears up to have functional quadriplegia at baseline with blindness here with nausea, vomiting. At this point patient appears to have positive SIRS criteria including tachycardia and leukocytosis. Possible source is likely a urinary tract infection. Furthermore, her documentation brought in from nursing home an x-ray of the chest was obtained which showed possible right lobe infiltrate in the bases. This could also means patient has an element of pneumonia but I suspect he had an aspiration event  possibly as he is bedbound, multiple episodes of nausea, vomiting. He is not hypoxic or appeared to be short of breath. He has no fever here. Blood cultures have been sent. I would also send in a urine culture and start the patient on Levaquin at this point. Patient appears to have obstipation secondary to possible gastric outlet obstruction and fecal impaction per the CAT scan as well. Initially he did refuse NG tube placement. Per nursing home staff usually he is awake, alert, oriented x3 but he is very "hard headed" and refuses vitals and some treatment at times. He did refuse NG tube, however, after multiple conversations with him, he allowed for another trail. An NG tube would more easily decompress his upper GI tract. I will also start him on some stool softeners and Fleet enema. Make the n.p.o. and start the patient on Zofran as well. He does have renal failure unknown baseline, however, per records he does have an element of chronic kidney disease. At this point I would start him on gentle fluids. Per his hemoglobin and hematocrit he appears to dehydrated and volume contracted as well. I would start him on some D5 one half normal saline given the hypernatremia. He does have functional quadriplegia and overall poor prognosis and functional capacity and palliative care consult has been obtained already. I would follow palliative care's input. His multiple myeloma remains untreated at this point. He does have mild hypercalcemia, likely secondary to dehydration and potential multiple myeloma which is not being treated currently per his wishes. I would start him on heparin for deep  vein thrombosis prophylaxis.   TOTAL TIME SPENT: 55 minutes.   CODE STATUS: Patient is DO NOT RESUSCITATE per outpatient records, his daughter and him.  ____________________________ Vivien Presto, MD sa:cms D: 08/06/2011 13:21:51 ET T: 08/06/2011 13:42:28 ET JOB#: [id number]  cc: Vivien Presto, MD, <Dictator> Rogelia Rohrer,  MD  Vivien Presto MD ELECTRONICALLY SIGNED 08/08/2011 19:13
